# Patient Record
Sex: Female | Born: 1940 | Race: White | Hispanic: No
Health system: Southern US, Community
[De-identification: ages and names within clinical notes are randomized; demographics above are authoritative.]

## PROBLEM LIST (undated history)

## (undated) DIAGNOSIS — I509 Heart failure, unspecified: Secondary | ICD-10-CM

## (undated) DIAGNOSIS — I219 Acute myocardial infarction, unspecified: Secondary | ICD-10-CM

## (undated) DIAGNOSIS — Z950 Presence of cardiac pacemaker: Secondary | ICD-10-CM

## (undated) DIAGNOSIS — K219 Gastro-esophageal reflux disease without esophagitis: Secondary | ICD-10-CM

## (undated) DIAGNOSIS — I2510 Atherosclerotic heart disease of native coronary artery without angina pectoris: Secondary | ICD-10-CM

## (undated) HISTORY — PX: OTHER SURGICAL HISTORY: SHX169

---

## 2013-10-09 ENCOUNTER — Ambulatory Visit: Payer: Self-pay

## 2013-12-08 ENCOUNTER — Ambulatory Visit: Payer: Self-pay

## 2013-12-08 LAB — CBC WITH DIFFERENTIAL/PLATELET
BASOS ABS: 0.1 10*3/uL (ref 0.0–0.1)
BASOS PCT: 0.8 %
EOS ABS: 0.1 10*3/uL (ref 0.0–0.7)
Eosinophil %: 1.7 %
HCT: 38.8 % (ref 35.0–47.0)
HGB: 13.2 g/dL (ref 12.0–16.0)
LYMPHS ABS: 1.2 10*3/uL (ref 1.0–3.6)
Lymphocyte %: 17 %
MCH: 31.7 pg (ref 26.0–34.0)
MCHC: 34.1 g/dL (ref 32.0–36.0)
MCV: 93 fL (ref 80–100)
MONOS PCT: 9.9 %
Monocyte #: 0.7 x10 3/mm (ref 0.2–0.9)
NEUTROS ABS: 4.9 10*3/uL (ref 1.4–6.5)
NEUTROS PCT: 70.6 %
Platelet: 219 10*3/uL (ref 150–440)
RBC: 4.18 10*6/uL (ref 3.80–5.20)
RDW: 14.8 % — ABNORMAL HIGH (ref 11.5–14.5)
WBC: 7 10*3/uL (ref 3.6–11.0)

## 2013-12-08 LAB — COMPREHENSIVE METABOLIC PANEL
ANION GAP: 7 (ref 7–16)
Albumin: 3.7 g/dL (ref 3.4–5.0)
Alkaline Phosphatase: 91 U/L
BILIRUBIN TOTAL: 0.8 mg/dL (ref 0.2–1.0)
BUN: 20 mg/dL — ABNORMAL HIGH (ref 7–18)
CALCIUM: 10.8 mg/dL — AB (ref 8.5–10.1)
CHLORIDE: 104 mmol/L (ref 98–107)
CO2: 30 mmol/L (ref 21–32)
Creatinine: 1.23 mg/dL (ref 0.60–1.30)
GFR CALC AF AMER: 51 — AB
GFR CALC NON AF AMER: 44 — AB
Glucose: 86 mg/dL (ref 65–99)
Osmolality: 283 (ref 275–301)
Potassium: 4.4 mmol/L (ref 3.5–5.1)
SGOT(AST): 21 U/L (ref 15–37)
SGPT (ALT): 28 U/L (ref 12–78)
Sodium: 141 mmol/L (ref 136–145)
Total Protein: 6.4 g/dL (ref 6.4–8.2)

## 2013-12-08 LAB — LIPASE, BLOOD: LIPASE: 97 U/L (ref 73–393)

## 2013-12-08 LAB — AMYLASE: Amylase: 31 U/L (ref 25–115)

## 2014-12-25 ENCOUNTER — Ambulatory Visit
Admission: EM | Admit: 2014-12-25 | Discharge: 2014-12-25 | Disposition: A | Discharge status: Home / Self Care | Payer: Medicare Other | Attending: Emergency Medicine | Admitting: Emergency Medicine

## 2014-12-25 ENCOUNTER — Ambulatory Visit: Payer: Medicare Other

## 2014-12-25 DIAGNOSIS — Z7982 Long term (current) use of aspirin: Secondary | ICD-10-CM | POA: Insufficient documentation

## 2014-12-25 DIAGNOSIS — I252 Old myocardial infarction: Secondary | ICD-10-CM | POA: Insufficient documentation

## 2014-12-25 DIAGNOSIS — I509 Heart failure, unspecified: Secondary | ICD-10-CM | POA: Diagnosis not present

## 2014-12-25 DIAGNOSIS — I2510 Atherosclerotic heart disease of native coronary artery without angina pectoris: Secondary | ICD-10-CM | POA: Diagnosis not present

## 2014-12-25 DIAGNOSIS — M25551 Pain in right hip: Secondary | ICD-10-CM | POA: Diagnosis present

## 2014-12-25 DIAGNOSIS — M545 Low back pain, unspecified: Secondary | ICD-10-CM

## 2014-12-25 DIAGNOSIS — Z79899 Other long term (current) drug therapy: Secondary | ICD-10-CM | POA: Diagnosis not present

## 2014-12-25 DIAGNOSIS — K219 Gastro-esophageal reflux disease without esophagitis: Secondary | ICD-10-CM | POA: Diagnosis not present

## 2014-12-25 DIAGNOSIS — G8929 Other chronic pain: Secondary | ICD-10-CM | POA: Diagnosis not present

## 2014-12-25 HISTORY — DX: Presence of cardiac pacemaker: Z95.0

## 2014-12-25 HISTORY — DX: Gastro-esophageal reflux disease without esophagitis: K21.9

## 2014-12-25 HISTORY — DX: Acute myocardial infarction, unspecified: I21.9

## 2014-12-25 HISTORY — DX: Heart failure, unspecified: I50.9

## 2014-12-25 HISTORY — DX: Atherosclerotic heart disease of native coronary artery without angina pectoris: I25.10

## 2014-12-25 MED ORDER — HYDROCODONE-ACETAMINOPHEN 5-325 MG PO TABS
1.0000 | ORAL_TABLET | Freq: Once | ORAL | Status: AC
  Administered 2014-12-25: 1 via ORAL

## 2014-12-25 NOTE — ED Notes (Signed)
Pt states "I have severe hip pain and I am out of my pain medication." Pt denies fall or other traumatic incident.

## 2014-12-25 NOTE — Discharge Instructions (Signed)
You may take 1 g of Tylenol up to 4 times a day as needed for pain.  This with the tramadol is an effective combination for pain.   .  Do not exceed 4 g of Tylenol from all sources in a day Some people find stretching and deep tissue massage helpful. Follow up with your primary care doctor.  Return to the ER if you get worse, have a fever >100.4, or for other concerns.

## 2014-12-25 NOTE — ED Provider Notes (Signed)
HPI  SUBJECTIVE:  Yvette Madden is a 74 y.o. female who presents with worsening lower back, right hip pain over the past week since having a sleep study. She describes the pain as similar her to her baseline, but is sharp, more intense than usual. She tried icy hot, heat, ice. No alleviating factors. She states that the pain is worse with any movement, and the worst in the morning. She denies any trauma to the area she denies fall. She denies any change in physical activity, lower extremity weakness, numbness tingling her legs, urinary, fecal incontinence, abdominal pain, unintentional weight loss, urinary urgency, frequency, other urinary complaints. No nausea, vomiting, fevers. 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 Patient is not sure she has osteopenia or osteoporosis. No history of cancer. Patient states that she has been taking 50 mg a tramadol twice a day. She states that normally this helps, but has not been helping lately. She is here for a refill on her pain medication.  Past Medical History  Diagnosis Date  . Coronary artery disease   . Pacemaker   . CHF (congestive heart failure)   . MI (myocardial infarction)     x2  . GERD (gastroesophageal reflux disease)     Past Surgical History  Procedure Laterality Date  . Coronary stents      No family history on file.  History  Substance Use Topics  . Smoking status: Not on file  . Smokeless tobacco: Not on file  . Alcohol Use: Not on file    No current facility-administered medications for this encounter.  Current outpatient prescriptions:  .  acetaminophen (TYLENOL) 325 MG tablet, Take 650 mg by mouth every 6 (six) hours as needed., Disp: , Rfl:  .  aspirin 81 MG tablet, Take 81 mg by mouth daily., Disp: , Rfl:  .  atorvastatin (LIPITOR) 80 MG tablet, Take 80 mg by mouth daily., Disp: , Rfl:  .  carvedilol (COREG) 6.25 MG  tablet, Take 6.25 mg by mouth 2 (two) times daily with a meal., Disp: , Rfl:  .  Cholecalciferol 2000 UNITS CAPS, Take by mouth., Disp: , Rfl:  .  clopidogrel (PLAVIX) 75 MG tablet, Take 75 mg by mouth daily., Disp: , Rfl:  .  clotrimazole-betamethasone (LOTRISONE) lotion, Apply topically 2 (two) times daily., Disp: , Rfl:  .  Fluticasone-Salmeterol (ADVAIR) 250-50 MCG/DOSE AEPB, Inhale 1 puff into the lungs 2 (two) times daily., Disp: , Rfl:  .  furosemide (LASIX) 20 MG tablet, Take 20 mg by mouth., Disp: , Rfl:  .  Ipratropium-Albuterol (COMBIVENT) 20-100 MCG/ACT AERS respimat, Inhale 1 puff into the lungs every 6 (six) hours., Disp: , Rfl:  .  losartan (COZAAR) 100 MG tablet, Take 25 mg by mouth daily., Disp: , Rfl:  .  meclizine (ANTIVERT) 25 MG tablet, Take 25 mg by mouth 3 (three) times daily., Disp: , Rfl:  .  montelukast (SINGULAIR) 10 MG tablet, Take 10 mg by mouth at bedtime., Disp: , Rfl:  .  pantoprazole (PROTONIX) 40 MG tablet, Take 40 mg by mouth daily., Disp: , Rfl:  .  spironolactone (ALDACTONE) 25 MG tablet, Take 25 mg by mouth daily., Disp: , Rfl:  .  traMADol (ULTRAM) 50 MG tablet, Take by mouth every 6 (six) hours as needed., Disp: , Rfl:   No Known Allergies   ROS  As noted in HPI.   Physical Exam  BP 120/60  mmHg  Pulse 64  Temp(Src) 97.8 F (36.6 C)  Resp 16  Ht  (1.676 m)  Wt 200 lb (90.719 kg)  BMI 32.30 kg/m2  SpO2 98%  LMP   Constitutional: Well developed, well nourished, no acute distress Eyes:  EOMI, conjunctiva normal bilaterally HENT: Normocephalic, atraumatic,mucus membranes moist Respiratory: Normal inspiratory effort Cardiovascular: Normal rate GI: nondistended soft, no suprapubic, flank tenderness  Back: No CVA tenderness. Positive bony tenderness of her lumbar spine, right paraspinal muscle tenderness. Pain aggravated with hip flexion against resistance. No pain with int/exet rotation hips bilaterally. Sensation baseline light touch  bilaterally for Pt, DTR's symmetric and intact bilaterally KJ, Motor symmetric bilateral 5/5 hip flexion, quadriceps, hamstrings, EHL, foot dorsiflexion, foot plantarflexion, gait somewhat antalgic but without apparent new ataxia. skin: No rash, skin intact Musculoskeletal: no deformities Neurologic: Alert & oriented x 3, no focal neuro deficits Psychiatric: Speech and behavior appropriate   ED Course  Dg Lumbar Spine Complete  12/25/2014   CLINICAL DATA:  Lower back pain for 14 days.  EXAM: LUMBAR SPINE - COMPLETE 4+ VIEW  COMPARISON:  Abdominal films 12/08/2013  FINDINGS: Normal alignment of lumbar vertebral bodies. There is joint space narrowing at L4-L5 and L5-S1. Associated endplate sclerosis and osteophytosis. No subluxation. No pars fracture. Atherosclerotic calcification of the abdominal aorta.  IMPRESSION: No acute findings of the lumbar spine. 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  Atherosclerotic calcification of the abdominal aorta.   Electronically Signed   By: Genevive Bi M.D.   On: 12/25/2014 15:58   Medications  HYDROcodone-acetaminophen (NORCO/VICODIN) 5-325 MG per tablet 1 tablet (1 tablet Oral Given 12/25/14 1531)    Orders Placed This Encounter  Procedures  . DG Lumbar Spine Complete    Standing Status: Standing     Number of Occurrences: 1     Standing Expiration Date:     Order Specific Question:  Reason for Exam (SYMPTOM  OR DIAGNOSIS REQUIRED)    Answer:  mildline low back tenderness r/o acute changes    No results found for this or any previous visit (from the past 24 hour(s)). Dg Lumbar Spine Complete  12/25/2014   CLINICAL DATA:  Lower back pain for 14 days.  EXAM: LUMBAR SPINE - COMPLETE 4+ VIEW  COMPARISON:  Abdominal films 12/08/2013  FINDINGS: Normal alignment of lumbar vertebral bodies. There is joint space narrowing at L4-L5 and L5-S1. Associated endplate sclerosis and osteophytosis. No subluxation. No pars  fracture. Atherosclerotic calcification of the abdominal aorta.  IMPRESSION: No acute findings of the lumbar spine. 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  Atherosclerotic calcification of the abdominal aorta.   Electronically Signed   By: Genevive Bi M.D.   On: 12/25/2014 15:58    ED Clinical Impression  Acute exacerbation of chronic low back pain   ED Assessment/Plan [facility] narcotic database reviewed. Patient has 2 tramadol prescriptions, last one filled in June. #60 with 2 refills from Dr. Wendie Simmer.   Imaging spine to rule out any acute changes given bony tenderness. Notified patient that she had 2 refills on her tramadol, she was not aware of this, and she is amenable to increasing the frequency at which she takes this. Advised her that she may take one pill every 6 hrs. patient has follow-up with primary care on Monday.  Reviewed imaging independently. DDD. No acute findings per radiology. See radiology report for full details.  No acute changes  per radiology. Patient may increase the frequency with which she takes tramadol. Will not rx anything new. Deep tissue massage, gentle stretching exercises. Discussed  imaging, MDM, plan and followup with patient. Discussed sn/sx that should prompt return to the UC or ED. Patient agrees with plan.   *This clinic note was created using Dragon dictation software. Therefore, there may be occasional mistakes despite careful proofreading.  ?    Domenick Gong, MD 12/25/14 [phone]

## 2015-07-13 IMAGING — CR DG LUMBAR SPINE 2-3V
3 series · 3 of 3 positions shown · non-contrast
Comparison: None

CLINICAL DATA: Lower bilateral abdominal pain and constipation

EXAM:
LUMBAR SPINE - 2-3 VIEW

[l-spine ap]
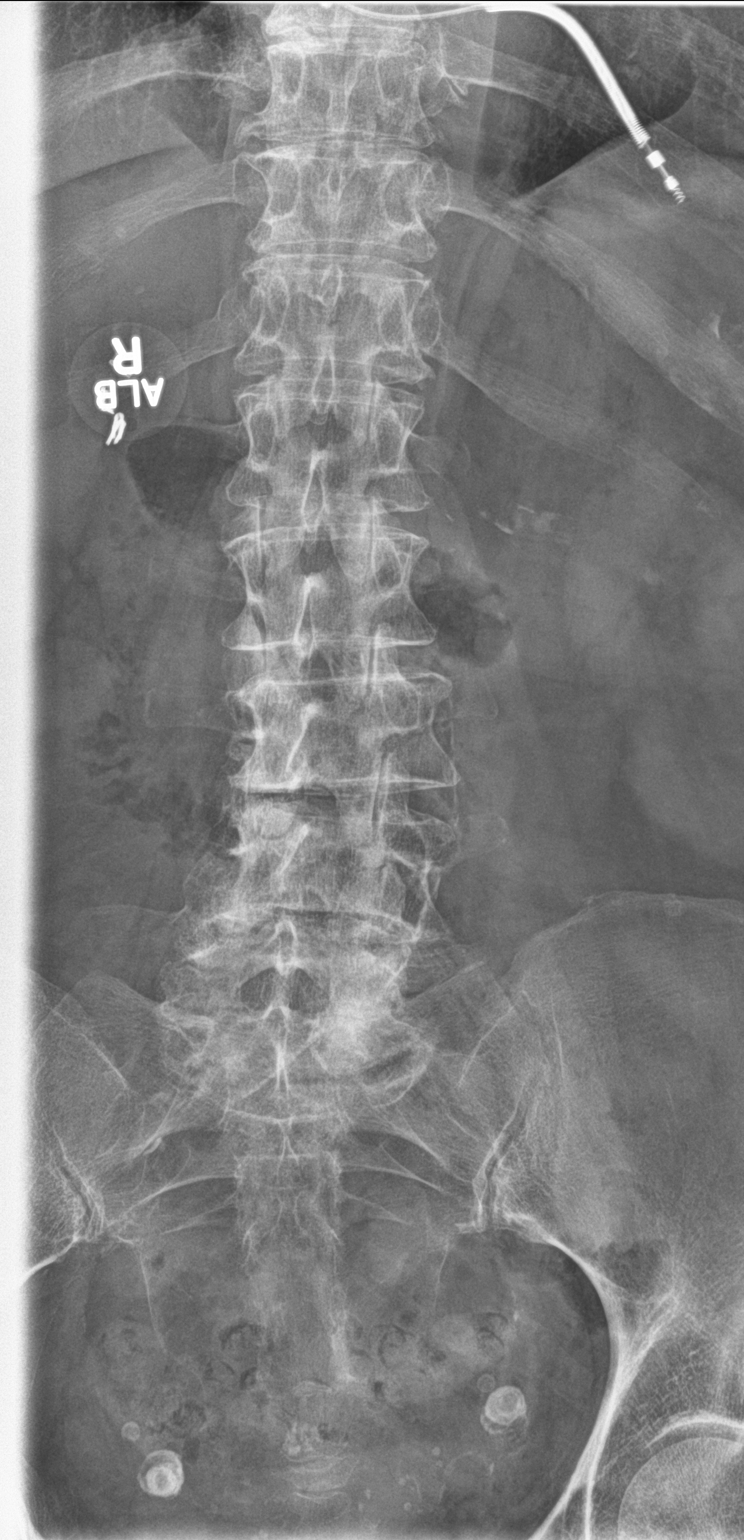

[l-spine lat]
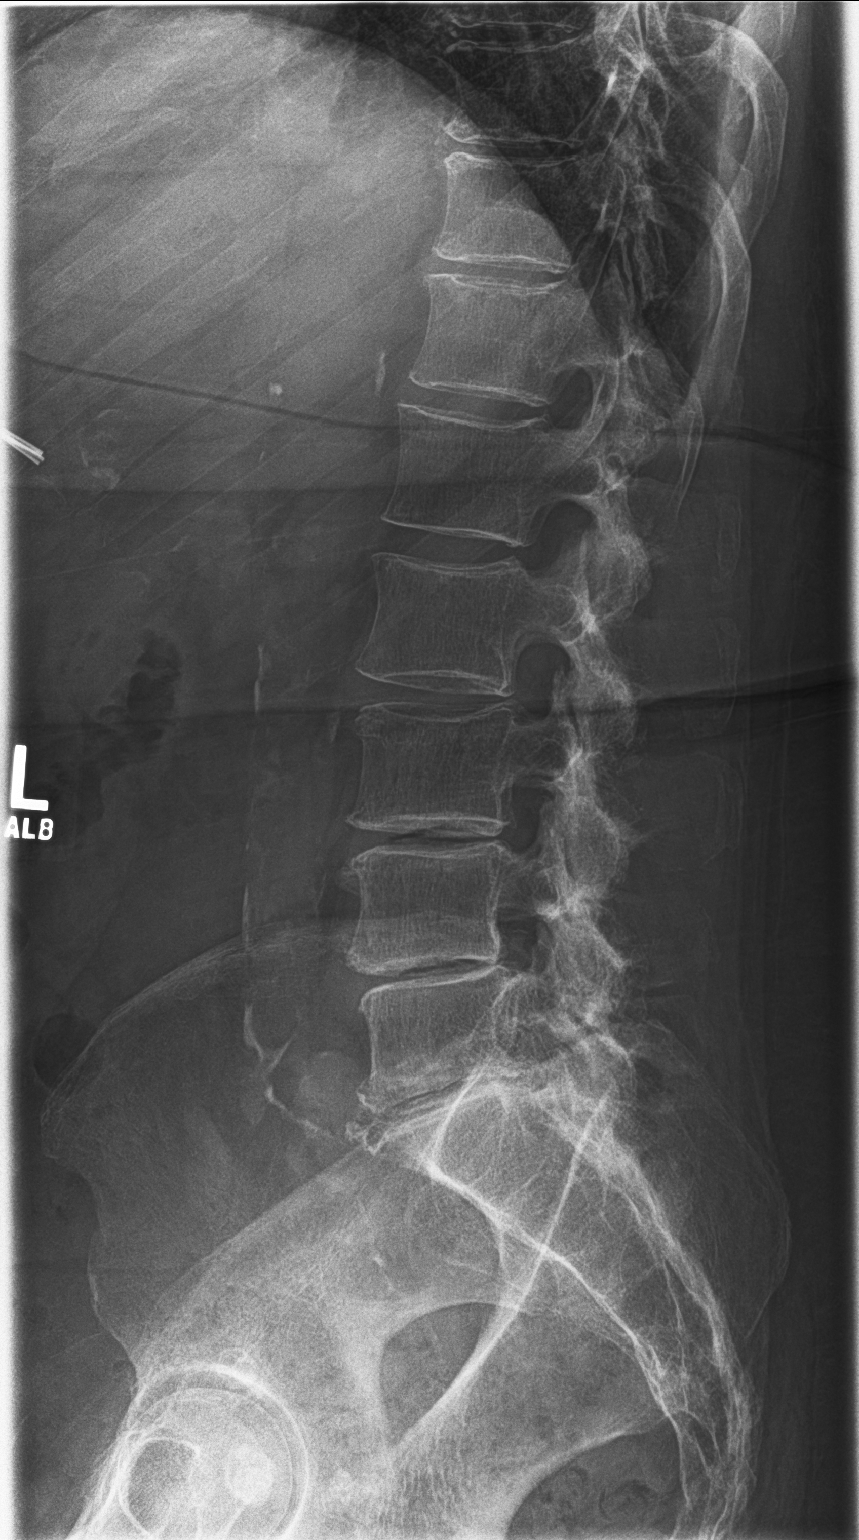

[l-spine spot]
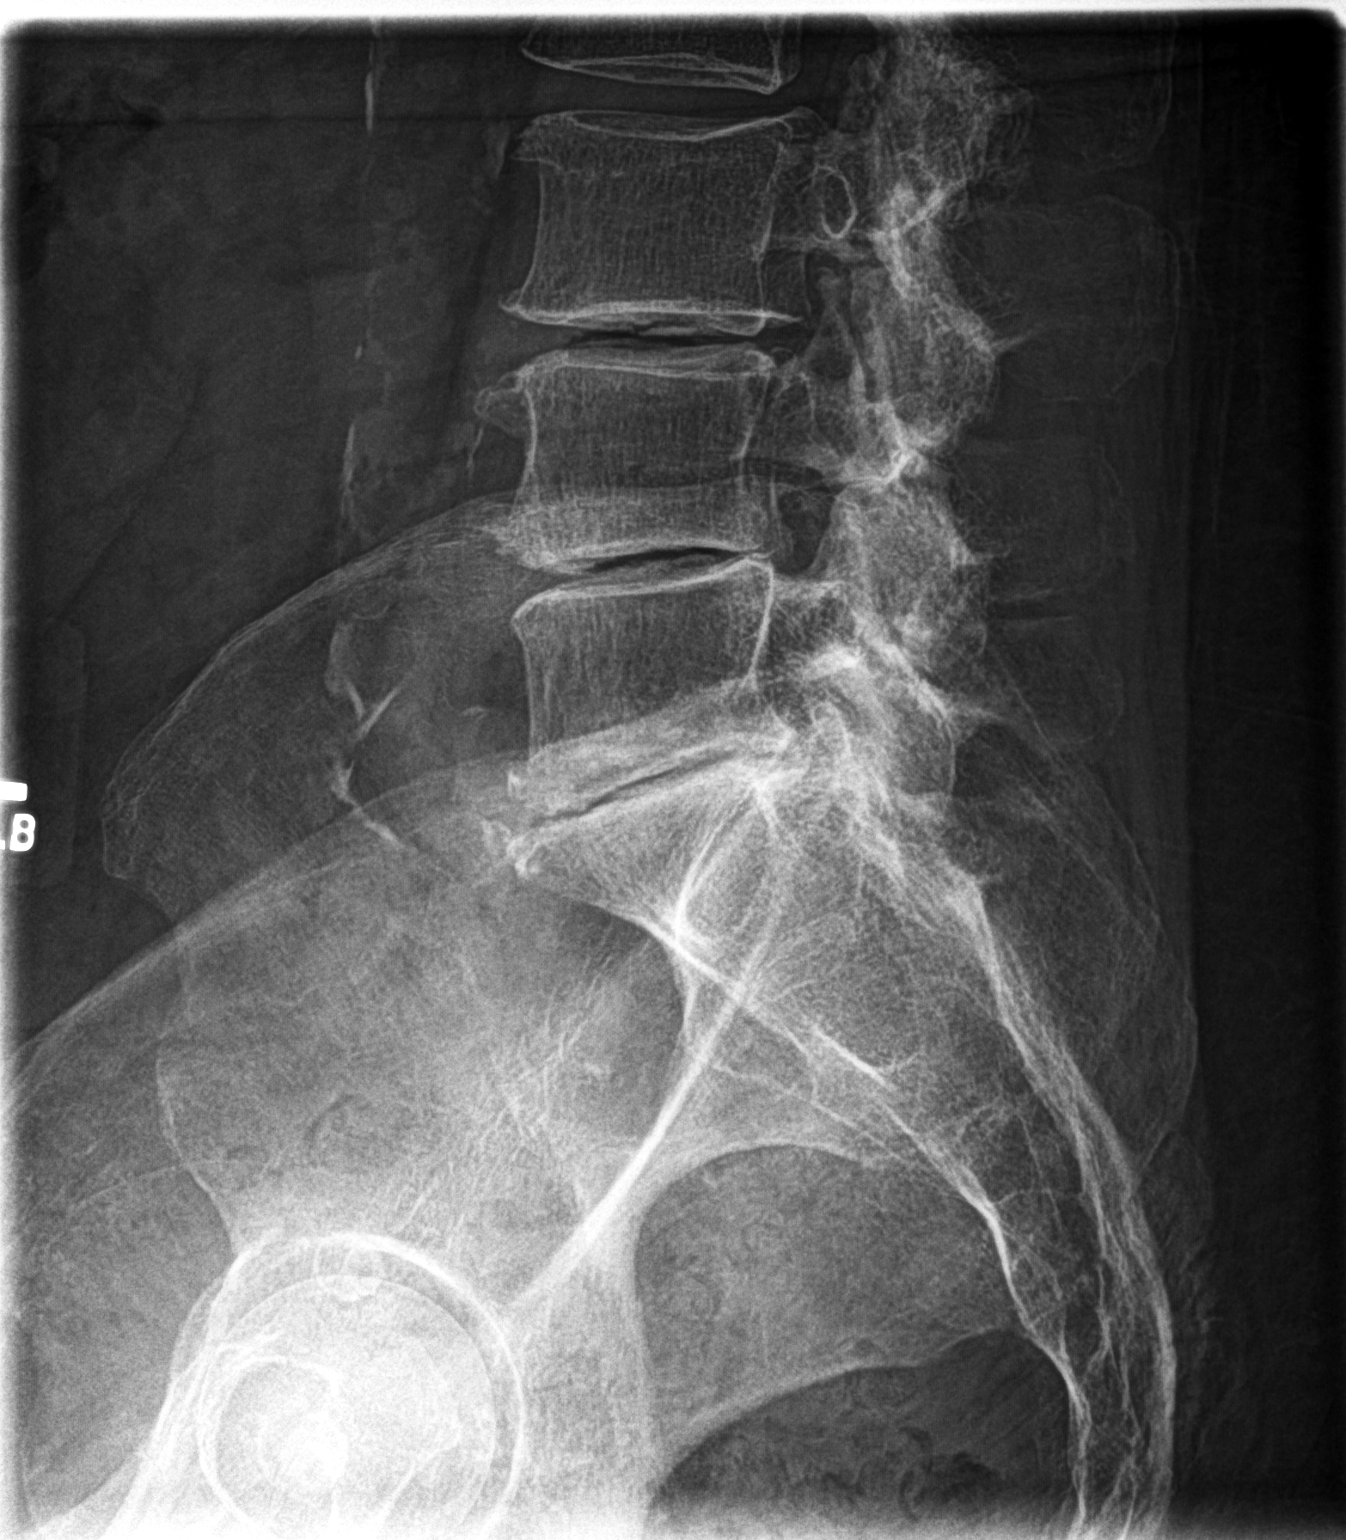

[3 of 3 positions shown; findings below may reference images not displayed]

FINDINGS: There is mild levoscoliosis of the lumbar spine. No acute loss of
vertebral body height or disc height. There is endplate spurring at
L3 through S1. There is joint space narrowing at L4-L5 and L5-S1.
Atherosclerotic calcification of the aorta noted.
IMPRESSION: 1. No acute findings lumbar spine.
2. Disc osteophytic disease from L3 through S1.

## 2015-07-13 IMAGING — CR DG ABDOMEN 3V
5 series · 5 of 5 positions shown · non-contrast
Comparison: None.

CLINICAL DATA: Abdominal pain.

EXAM:
ABDOMEN SERIES

[chest pa]
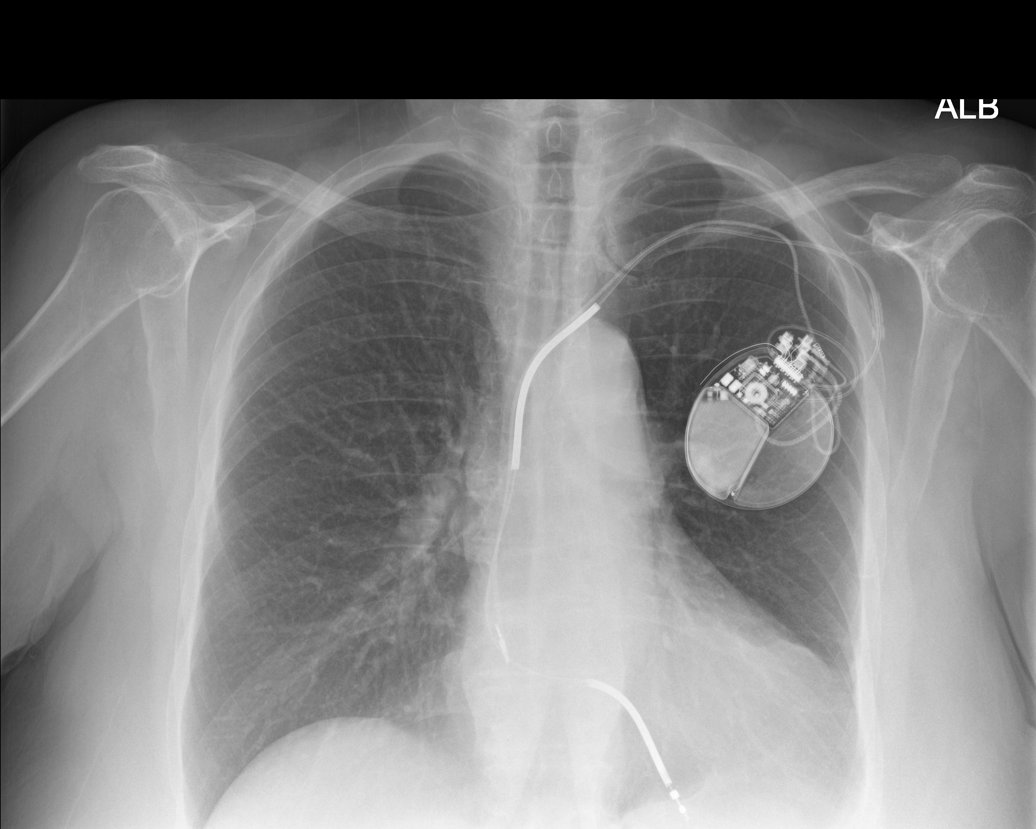

[abdomen erect (1 of 2)]
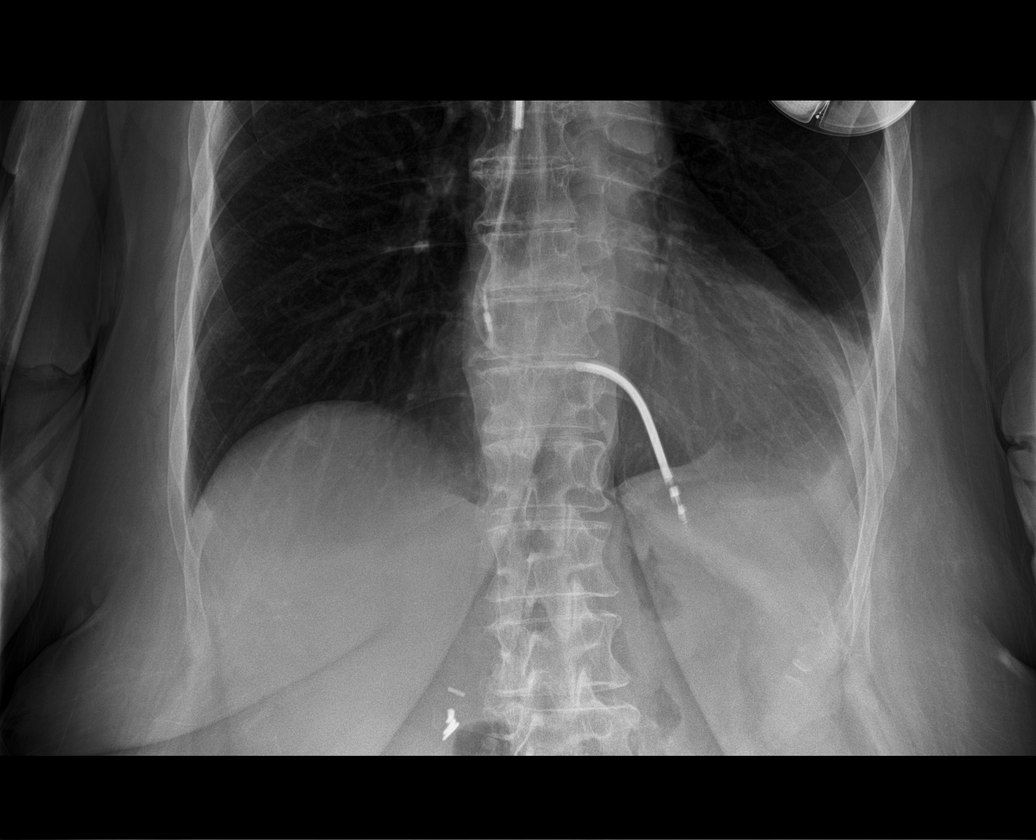

[abdomen erect (2 of 2)]
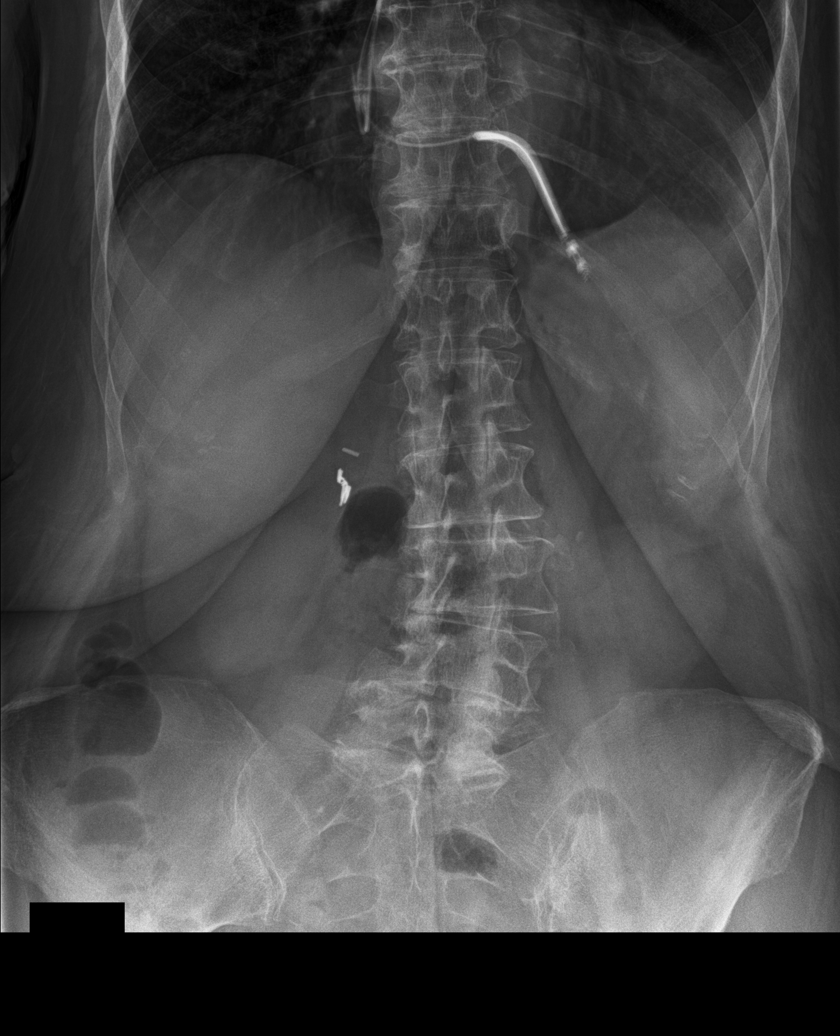

[abdomen supine (1 of 2)]
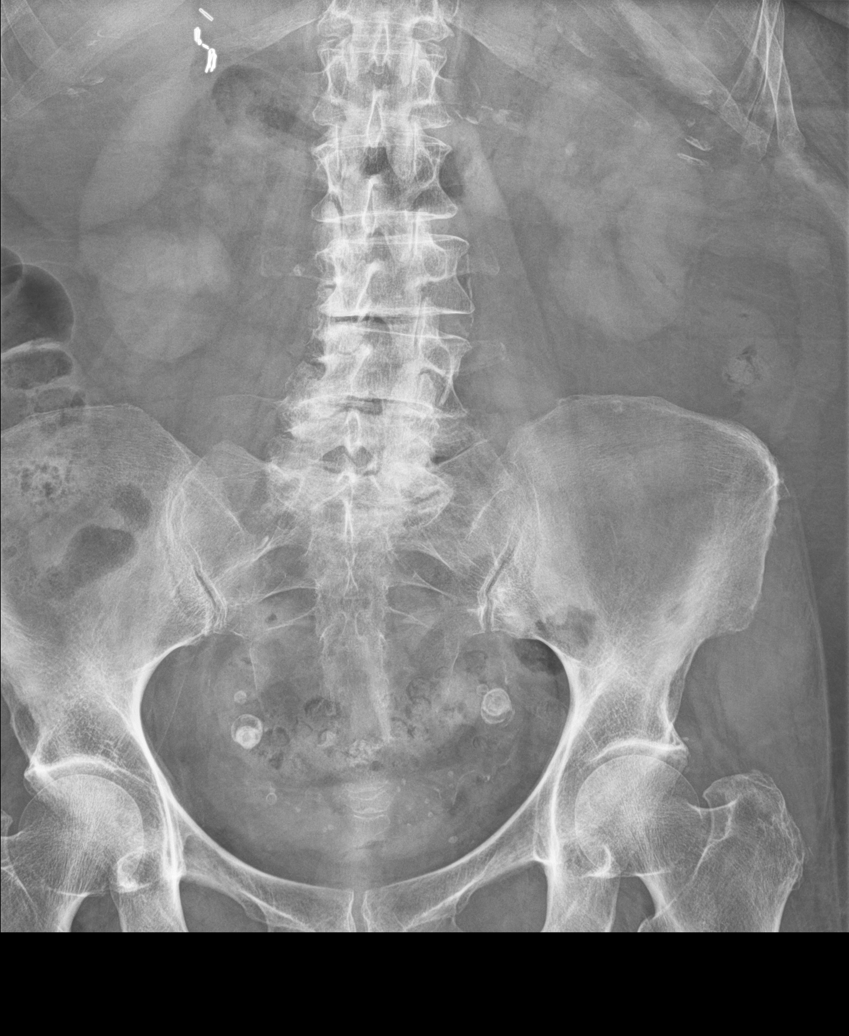

[abdomen supine (2 of 2)]
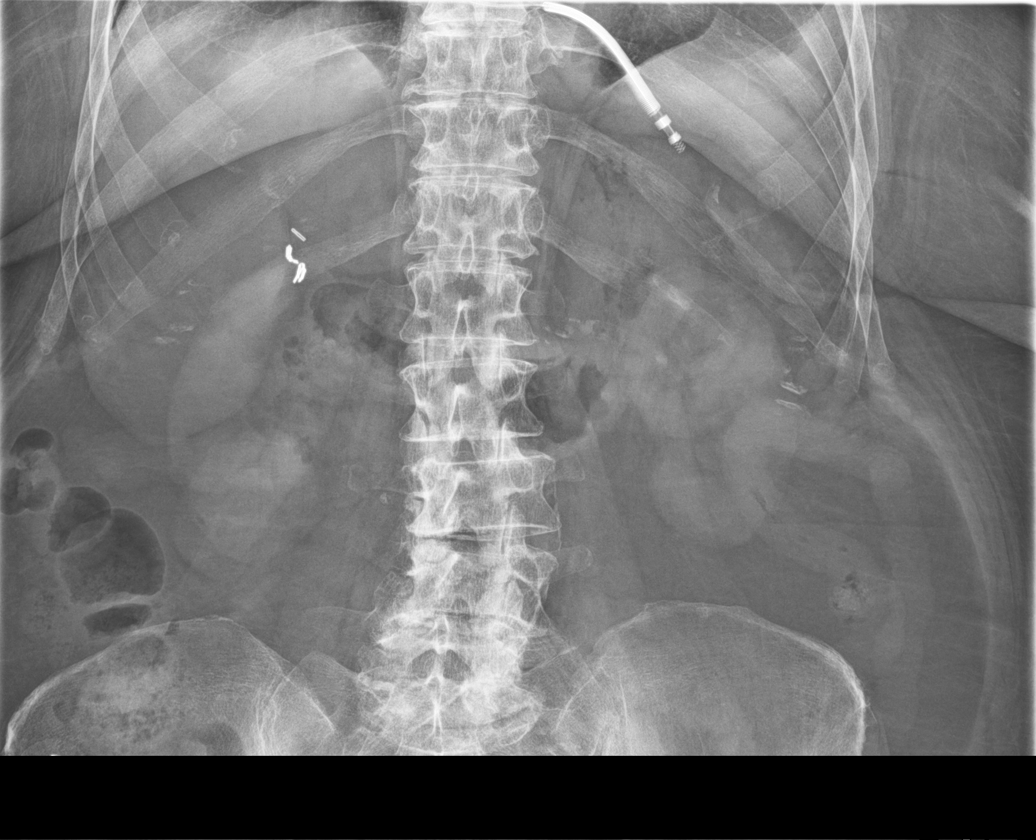

[5 of 5 positions shown; findings below may reference images not displayed]

FINDINGS: There is no evidence of dilated bowel loops or free intraperitoneal
air. Possible left renal calculus is noted. Status post
cholecystectomy. Degenerative changes of lower lumbar spine are
noted. Left-sided pacemaker is noted. Heart size and mediastinal
contours are within normal limits. Both lungs are clear.
IMPRESSION: No evidence of bowel obstruction or ileus. Possible left renal
calculus.
